# Patient Record
Sex: Female | Born: 1960 | Race: White | Hispanic: No | Marital: Married | Smoking: Never smoker
Health system: Southern US, Community
[De-identification: ages and names within clinical notes are randomized; demographics above are authoritative.]

## PROBLEM LIST (undated history)

## (undated) DIAGNOSIS — Z9289 Personal history of other medical treatment: Secondary | ICD-10-CM

## (undated) DIAGNOSIS — Z9889 Other specified postprocedural states: Secondary | ICD-10-CM

## (undated) DIAGNOSIS — J45909 Unspecified asthma, uncomplicated: Secondary | ICD-10-CM

## (undated) DIAGNOSIS — I10 Essential (primary) hypertension: Secondary | ICD-10-CM

## (undated) DIAGNOSIS — K219 Gastro-esophageal reflux disease without esophagitis: Secondary | ICD-10-CM

## (undated) DIAGNOSIS — J189 Pneumonia, unspecified organism: Secondary | ICD-10-CM

## (undated) DIAGNOSIS — F32A Depression, unspecified: Secondary | ICD-10-CM

## (undated) DIAGNOSIS — D649 Anemia, unspecified: Secondary | ICD-10-CM

## (undated) DIAGNOSIS — R112 Nausea with vomiting, unspecified: Secondary | ICD-10-CM

## (undated) DIAGNOSIS — M1990 Unspecified osteoarthritis, unspecified site: Secondary | ICD-10-CM

## (undated) DIAGNOSIS — K5900 Constipation, unspecified: Secondary | ICD-10-CM

## (undated) DIAGNOSIS — F419 Anxiety disorder, unspecified: Secondary | ICD-10-CM

## (undated) DIAGNOSIS — F329 Major depressive disorder, single episode, unspecified: Secondary | ICD-10-CM

## (undated) HISTORY — PX: LEG SURGERY: SHX1003

## (undated) HISTORY — PX: ABDOMINAL HYSTERECTOMY: SHX81

## (undated) HISTORY — PX: TUBAL LIGATION: SHX77

## (undated) HISTORY — PX: ANKLE SURGERY: SHX546

---

## 2015-08-06 ENCOUNTER — Emergency Department (HOSPITAL_BASED_OUTPATIENT_CLINIC_OR_DEPARTMENT_OTHER)
Admission: EM | Admit: 2015-08-06 | Discharge: 2015-08-06 | Disposition: A | Discharge status: Home / Self Care | Payer: 59 | Source: Home / Self Care | Attending: Emergency Medicine | Admitting: Emergency Medicine

## 2015-08-06 ENCOUNTER — Emergency Department (HOSPITAL_BASED_OUTPATIENT_CLINIC_OR_DEPARTMENT_OTHER): Payer: 59

## 2015-08-06 ENCOUNTER — Encounter (HOSPITAL_BASED_OUTPATIENT_CLINIC_OR_DEPARTMENT_OTHER): Payer: Self-pay | Admitting: *Deleted

## 2015-08-06 DIAGNOSIS — I10 Essential (primary) hypertension: Secondary | ICD-10-CM | POA: Diagnosis not present

## 2015-08-06 DIAGNOSIS — Z79899 Other long term (current) drug therapy: Secondary | ICD-10-CM | POA: Diagnosis not present

## 2015-08-06 DIAGNOSIS — K219 Gastro-esophageal reflux disease without esophagitis: Secondary | ICD-10-CM | POA: Diagnosis not present

## 2015-08-06 DIAGNOSIS — S6992XA Unspecified injury of left wrist, hand and finger(s), initial encounter: Secondary | ICD-10-CM | POA: Diagnosis present

## 2015-08-06 DIAGNOSIS — S59292A Other physeal fracture of lower end of radius, left arm, initial encounter for closed fracture: Secondary | ICD-10-CM | POA: Diagnosis not present

## 2015-08-06 DIAGNOSIS — J45909 Unspecified asthma, uncomplicated: Secondary | ICD-10-CM | POA: Diagnosis not present

## 2015-08-06 DIAGNOSIS — S52502A Unspecified fracture of the lower end of left radius, initial encounter for closed fracture: Secondary | ICD-10-CM

## 2015-08-06 DIAGNOSIS — W1809XA Striking against other object with subsequent fall, initial encounter: Secondary | ICD-10-CM | POA: Diagnosis not present

## 2015-08-06 DIAGNOSIS — S52572A Other intraarticular fracture of lower end of left radius, initial encounter for closed fracture: Secondary | ICD-10-CM | POA: Diagnosis not present

## 2015-08-06 DIAGNOSIS — F329 Major depressive disorder, single episode, unspecified: Secondary | ICD-10-CM | POA: Diagnosis not present

## 2015-08-06 DIAGNOSIS — S6412XA Injury of median nerve at wrist and hand level of left arm, initial encounter: Secondary | ICD-10-CM | POA: Diagnosis not present

## 2015-08-06 DIAGNOSIS — S52612A Displaced fracture of left ulna styloid process, initial encounter for closed fracture: Secondary | ICD-10-CM | POA: Diagnosis not present

## 2015-08-06 HISTORY — DX: Gastro-esophageal reflux disease without esophagitis: K21.9

## 2015-08-06 HISTORY — DX: Essential (primary) hypertension: I10

## 2015-08-06 MED ORDER — OXYCODONE-ACETAMINOPHEN 5-325 MG PO TABS
2.0000 | ORAL_TABLET | Freq: Once | ORAL | Status: AC
  Administered 2015-08-06: 2 via ORAL
  Filled 2015-08-06: qty 2

## 2015-08-06 MED ORDER — OXYCODONE-ACETAMINOPHEN 5-325 MG PO TABS: 1.0000 | ORAL_TABLET | Freq: Four times a day (QID) | ORAL | Status: DC | PRN

## 2015-08-06 MED ORDER — HYDROMORPHONE HCL 1 MG/ML IJ SOLN
1.0000 mg | Freq: Once | INTRAMUSCULAR | Status: AC
  Administered 2015-08-06: 1 mg via INTRAMUSCULAR
  Filled 2015-08-06: qty 1

## 2015-08-06 MED ORDER — HYDROMORPHONE HCL 1 MG/ML IJ SOLN: 1.0000 mg | Freq: Once | INTRAMUSCULAR | Status: DC

## 2015-08-06 NOTE — ED Provider Notes (Signed)
CSN: [id number]     Arrival date & time 08/06/15  2003 History   First MD Initiated Contact with Patient 08/06/15 2010     Chief Complaint  Patient presents with  . Fall  . Wrist Injury     (Consider location/radiation/quality/duration/timing/severity/associated sxs/prior Treatment) HPI Comments: 54 year old female presenting with left wrist pain after accidentally tripping and falling over a recliner less than 1 hour PTA. She landed onto her left wrist. Has throbbing pain rated 8/10 that radiates to the middle of her forearm and limited movement. No numbness or tingling. No medications prior to arrival.  Patient is a 54 y.o. female presenting with fall and wrist injury. The history is provided by the patient.  Fall This is a new problem. The current episode started today.  Wrist Injury   Past Medical History  Diagnosis Date  . Hypertension   . GERD (gastroesophageal reflux disease)    Past Surgical History  Procedure Laterality Date  . Leg surgery    . Ankle surgery    . Abdominal hysterectomy     No family history on file. Social History  Substance Use Topics  . Smoking status: Never Smoker   . Smokeless tobacco: None  . Alcohol Use: Yes     Comment: occasional   OB History    No data available     Review of Systems  Musculoskeletal:       + L wrist pain and swelling.  All other systems reviewed and are negative.     Allergies  Review of patient's allergies indicates no known allergies.  Home Medications   Prior to Admission medications   Medication Sig Start Date End Date Taking? Authorizing Provider  HYDROCHLOROTHIAZIDE PO Take by mouth.   Yes Historical Provider, MD  OMEPRAZOLE PO Take by mouth.   Yes Historical Provider, MD  oxyCODONE-acetaminophen (PERCOCET) 5-325 MG per tablet Take 1-2 tablets by mouth every 6 (six) hours as needed for severe pain. 08/06/15   Dorthula Bier M Leaman Abe, PA-C   BP 152/98 mmHg  Pulse 78  Temp(Src) 98.2 F (36.8 C) (Oral)  Resp  18  Ht  (1.626 m)  Wt 140 lb (63.504 kg)  BMI 24.02 kg/m2  SpO2 100% Physical Exam  Constitutional: She is oriented to person, place, and time. She appears well-developed and well-nourished. No distress.  HENT:  Head: Normocephalic and atraumatic.  Mouth/Throat: Oropharynx is clear and moist.  Eyes: Conjunctivae and EOM are normal.  Neck: Normal range of motion. Neck supple.  Cardiovascular: Normal rate, regular rhythm and normal heart sounds.   Pulmonary/Chest: Effort normal and breath sounds normal. No respiratory distress.  Musculoskeletal:  L wrist- TTP over carpal bones and distal radius with swelling over distal radius and anatomical snuffbox. ROM limited by pain. Able to wiggle fingers. +2 radial pulse. Cap refill < 2 seconds. Elbow normal.  Neurological: She is alert and oriented to person, place, and time. No sensory deficit.  Skin: Skin is warm and dry.  Psychiatric: She has a normal mood and affect. Her behavior is normal.  Nursing note and vitals reviewed.   ED Course  Procedures (including critical care time) Labs Review Labs Reviewed - No data to display  Imaging Review Dg Wrist Complete Left  08/06/2015   CLINICAL DATA:  Fall from a recliner onto outstretched hand. Pain and swelling in the wrist.  EXAM: LEFT WRIST - COMPLETE 3+ VIEW  COMPARISON:  None.  FINDINGS: Distal radial metaphyseal fracture, with transverse metaphyseal component and  longitudinal component extending to the distal radial articular surface. Apex volar angulation.  Transverse fracture of the ulnar styloid.  IMPRESSION: 1. Distal radial fracture with transverse metaphyseal component and longitudinal extension to the distal radial articular surface. 2. Transverse fracture of the ulnar styloid.   Electronically Signed   By: Gaylyn Rong M.D.   On: 08/06/2015 21:21   I have personally reviewed and evaluated these images and lab results as part of my medical decision-making.   EKG  Interpretation None      MDM   Final diagnoses:  Distal radius fracture, left, closed, initial encounter   Injury after mechanical fall. Neurovascularly intact distally. X-ray results as stated above. Dr. Rubin Payor spoke with Dr. Amanda Pea who looked at xrays and requests a splint and for the pt to f/u in his office tomorrow at [facility] as this may require surgical intervention. Splint applied. Rx percocet. Stable for d/c. Return precautions given. Patient states understanding of treatment care plan and is agreeable.  Discussed with attending Dr. Rubin Payor who agrees with plan of care.  Kathrynn Speed, PA-C 08/06/15 2132  Benjiman Core, MD 08/06/15 2308

## 2015-08-06 NOTE — Discharge Instructions (Signed)
Take percocet for severe pain only. No driving or operating heavy machinery while taking percocet. This medication may cause drowsiness.  Follow up with Dr. Amanda Pea tomorrow (8/17) at 4PM.  Radial Fracture You have a broken bone (fracture) of the forearm. This is the part of your arm between the elbow and your wrist. Your forearm is made up of two bones. These are the radius and ulna. Your fracture is in the radial shaft. This is the bone in your forearm located on the thumb side. A cast or splint is used to protect and keep your injured bone from moving. The cast or splint will be on generally for about 5 to 6 weeks, with individual variations. HOME CARE INSTRUCTIONS   Keep the injured part elevated while sitting or lying down. Keep the injury above the level of your heart (the center of the chest). This will decrease swelling and pain.  Apply ice to the injury for 15-20 minutes, 03-04 times per day while awake, for 2 days. Put the ice in a plastic bag and place a towel between the bag of ice and your cast or splint.  Move your fingers to avoid stiffness and minimize swelling.  If you have a plaster or fiberglass cast:  Do not try to scratch the skin under the cast using sharp or pointed objects.  Check the skin around the cast every day. You may put lotion on any red or sore areas.  Keep your cast dry and clean.  If you have a plaster splint:  Wear the splint as directed.  You may loosen the elastic around the splint if your fingers become numb, tingle, or turn cold or blue.  Do not put pressure on any part of your cast or splint. It may break. Rest your cast only on a pillow for the first 24 hours until it is fully hardened.  Your cast or splint can be protected during bathing with a plastic bag. Do not lower the cast or splint into water.  Only take over-the-counter or prescription medicines for pain, discomfort, or fever as directed by your caregiver. SEEK IMMEDIATE MEDICAL CARE  IF:   Your cast gets damaged or breaks.  You have more severe pain or swelling than you did before getting the cast.  You have severe pain when stretching your fingers.  There is a bad smell, new stains and/or pus-like (purulent) drainage coming from under the cast.  Your fingers or hand turn pale or blue and become cold or your loose feeling. Document Released: 05/20/2006 Document Revised: 02/29/2012 Document Reviewed: 08/16/2006 [facility] Patient Information 2015 [place], [place]. This information is not intended to replace advice given to you by your health care provider. Make sure you discuss any questions you have with your health care provider.

## 2015-08-06 NOTE — ED Notes (Signed)
Fell over a recliner. Injury to her left wrist. Deformity noted.

## 2015-08-07 ENCOUNTER — Other Ambulatory Visit (HOSPITAL_COMMUNITY): Payer: Self-pay | Admitting: *Deleted

## 2015-08-07 ENCOUNTER — Encounter (HOSPITAL_COMMUNITY): Payer: Self-pay | Admitting: *Deleted

## 2015-08-07 NOTE — Progress Notes (Signed)
Danielle Wagner was not at home when I spoke with her for her pre-op call. She couldn't remember all of her meds or dosages. She thinks she is on Metoprolol for her HTN. I told her that a pharmacy tech would probably call her tonight and to have her meds ready at that time and if they didn't to make a list with names, dosages and instruction to bring with her tomorrow. I told her if she is on Metoprolol, to take that in the AM along with her Prilosec, anxiety med (again she wasn't sure of name), Albuterol inhaler - if needed and Oxycodone - if needed. Danielle Wagner states that Dr. Amanda Pea told her to eat up until 6 AM tomorrow since her surgery is likely to be around 6:30 PM.

## 2015-08-08 ENCOUNTER — Inpatient Hospital Stay (HOSPITAL_COMMUNITY): Payer: 59 | Admitting: Anesthesiology

## 2015-08-08 ENCOUNTER — Encounter (HOSPITAL_COMMUNITY): Payer: Self-pay | Admitting: General Practice

## 2015-08-08 ENCOUNTER — Ambulatory Visit (HOSPITAL_COMMUNITY)
Admission: RE | Admit: 2015-08-08 | Discharge: 2015-08-08 | DRG: 512 | Disposition: A | Discharge status: Home / Self Care | Payer: 59 | Source: Ambulatory Visit | Attending: Orthopedic Surgery | Admitting: Orthopedic Surgery

## 2015-08-08 ENCOUNTER — Encounter (HOSPITAL_COMMUNITY)
Admission: RE | Disposition: A | Discharge status: Home / Self Care | Payer: Self-pay | Source: Ambulatory Visit | Attending: Orthopedic Surgery

## 2015-08-08 ENCOUNTER — Other Ambulatory Visit: Payer: Self-pay | Admitting: Orthopedic Surgery

## 2015-08-08 DIAGNOSIS — S59292A Other physeal fracture of lower end of radius, left arm, initial encounter for closed fracture: Secondary | ICD-10-CM | POA: Insufficient documentation

## 2015-08-08 DIAGNOSIS — J45909 Unspecified asthma, uncomplicated: Secondary | ICD-10-CM | POA: Insufficient documentation

## 2015-08-08 DIAGNOSIS — K219 Gastro-esophageal reflux disease without esophagitis: Secondary | ICD-10-CM | POA: Insufficient documentation

## 2015-08-08 DIAGNOSIS — Z79899 Other long term (current) drug therapy: Secondary | ICD-10-CM | POA: Insufficient documentation

## 2015-08-08 DIAGNOSIS — S52612A Displaced fracture of left ulna styloid process, initial encounter for closed fracture: Secondary | ICD-10-CM | POA: Insufficient documentation

## 2015-08-08 DIAGNOSIS — S52572A Other intraarticular fracture of lower end of left radius, initial encounter for closed fracture: Secondary | ICD-10-CM | POA: Insufficient documentation

## 2015-08-08 DIAGNOSIS — W1809XA Striking against other object with subsequent fall, initial encounter: Secondary | ICD-10-CM | POA: Insufficient documentation

## 2015-08-08 DIAGNOSIS — S6412XA Injury of median nerve at wrist and hand level of left arm, initial encounter: Secondary | ICD-10-CM | POA: Insufficient documentation

## 2015-08-08 DIAGNOSIS — F329 Major depressive disorder, single episode, unspecified: Secondary | ICD-10-CM | POA: Diagnosis not present

## 2015-08-08 DIAGNOSIS — I10 Essential (primary) hypertension: Secondary | ICD-10-CM | POA: Insufficient documentation

## 2015-08-08 DIAGNOSIS — S52502A Unspecified fracture of the lower end of left radius, initial encounter for closed fracture: Secondary | ICD-10-CM | POA: Diagnosis not present

## 2015-08-08 HISTORY — PX: OPEN REDUCTION INTERNAL FIXATION (ORIF) DISTAL RADIAL FRACTURE: SHX5989

## 2015-08-08 HISTORY — DX: Nausea with vomiting, unspecified: Z98.890

## 2015-08-08 HISTORY — DX: Other specified postprocedural states: R11.2

## 2015-08-08 HISTORY — PX: CARPAL TUNNEL RELEASE: SHX101

## 2015-08-08 HISTORY — DX: Constipation, unspecified: K59.00

## 2015-08-08 HISTORY — DX: Pneumonia, unspecified organism: J18.9

## 2015-08-08 HISTORY — DX: Personal history of other medical treatment: Z92.89

## 2015-08-08 HISTORY — DX: Anxiety disorder, unspecified: F41.9

## 2015-08-08 HISTORY — DX: Major depressive disorder, single episode, unspecified: F32.9

## 2015-08-08 HISTORY — DX: Depression, unspecified: F32.A

## 2015-08-08 HISTORY — DX: Unspecified osteoarthritis, unspecified site: M19.90

## 2015-08-08 HISTORY — DX: Anemia, unspecified: D64.9

## 2015-08-08 HISTORY — DX: Unspecified asthma, uncomplicated: J45.909

## 2015-08-08 LAB — BASIC METABOLIC PANEL
Anion gap: 10 (ref 5–15)
BUN: 11 mg/dL (ref 6–20)
CALCIUM: 10 mg/dL (ref 8.9–10.3)
CO2: 26 mmol/L (ref 22–32)
Chloride: 102 mmol/L (ref 101–111)
Creatinine, Ser: 0.83 mg/dL (ref 0.44–1.00)
GFR calc Af Amer: >60 mL/min (ref >60)
GLUCOSE: 112 mg/dL — AB (ref 65–99)
POTASSIUM: 3.8 mmol/L (ref 3.5–5.1)
Sodium: 138 mmol/L (ref 135–145)

## 2015-08-08 LAB — CBC
HEMATOCRIT: 41.2 % (ref 36.0–46.0)
Hemoglobin: 14.1 g/dL (ref 12.0–15.0)
MCH: 32.6 pg (ref 26.0–34.0)
MCHC: 34.2 g/dL (ref 30.0–36.0)
MCV: 95.2 fL (ref 78.0–100.0)
Platelets: 203 10*3/uL (ref 150–400)
RBC: 4.33 MIL/uL (ref 3.87–5.11)
RDW: 12.4 % (ref 11.5–15.5)
WBC: 6.1 10*3/uL (ref 4.0–10.5)

## 2015-08-08 SURGERY — OPEN REDUCTION INTERNAL FIXATION (ORIF) DISTAL RADIUS FRACTURE
Anesthesia: General | Site: Wrist | Laterality: Left

## 2015-08-08 MED ORDER — FENTANYL CITRATE (PF) 100 MCG/2ML IJ SOLN
INTRAMUSCULAR | Status: AC
  Administered 2015-08-08: 50 ug via INTRAVENOUS
  Filled 2015-08-08: qty 2

## 2015-08-08 MED ORDER — MIDAZOLAM HCL 2 MG/2ML IJ SOLN
2.0000 mg | Freq: Once | INTRAMUSCULAR | Status: AC
  Administered 2015-08-08: 2 mg via INTRAVENOUS
  Filled 2015-08-08: qty 2

## 2015-08-08 MED ORDER — 0.9 % SODIUM CHLORIDE (POUR BTL) OPTIME
TOPICAL | Status: DC | PRN
  Administered 2015-08-08: 1000 mL

## 2015-08-08 MED ORDER — SODIUM CHLORIDE 0.45 % IV SOLN: INTRAVENOUS | Status: DC

## 2015-08-08 MED ORDER — ONDANSETRON HCL 4 MG/2ML IJ SOLN
INTRAMUSCULAR | Status: AC
  Filled 2015-08-08: qty 2

## 2015-08-08 MED ORDER — BUPIVACAINE HCL (PF) 0.25 % IJ SOLN
INTRAMUSCULAR | Status: AC
  Filled 2015-08-08: qty 30

## 2015-08-08 MED ORDER — ONDANSETRON HCL 4 MG/2ML IJ SOLN
INTRAMUSCULAR | Status: DC | PRN
  Administered 2015-08-08: 4 mg via INTRAVENOUS

## 2015-08-08 MED ORDER — DEXAMETHASONE SODIUM PHOSPHATE 4 MG/ML IJ SOLN
INTRAMUSCULAR | Status: AC
  Filled 2015-08-08: qty 1

## 2015-08-08 MED ORDER — LACTATED RINGERS IV SOLN
INTRAVENOUS | Status: DC
  Administered 2015-08-08: 16:00:00 via INTRAVENOUS

## 2015-08-08 MED ORDER — MIDAZOLAM HCL 2 MG/2ML IJ SOLN
INTRAMUSCULAR | Status: AC
  Administered 2015-08-08: 2 mg via INTRAVENOUS
  Filled 2015-08-08: qty 2

## 2015-08-08 MED ORDER — FENTANYL CITRATE (PF) 100 MCG/2ML IJ SOLN
100.0000 ug | Freq: Once | INTRAMUSCULAR | Status: AC
  Administered 2015-08-08: 50 ug via INTRAVENOUS
  Filled 2015-08-08: qty 2

## 2015-08-08 MED ORDER — METHOCARBAMOL 500 MG PO TABS: 500.0000 mg | ORAL_TABLET | Freq: Four times a day (QID) | ORAL | Status: AC

## 2015-08-08 MED ORDER — DEXAMETHASONE SODIUM PHOSPHATE 4 MG/ML IJ SOLN
INTRAMUSCULAR | Status: DC | PRN
  Administered 2015-08-08: 4 mg via INTRAVENOUS

## 2015-08-08 MED ORDER — ONDANSETRON HCL 4 MG/2ML IJ SOLN
4.0000 mg | Freq: Once | INTRAMUSCULAR | Status: AC | PRN
  Administered 2015-08-08: 4 mg via INTRAVENOUS

## 2015-08-08 MED ORDER — MIDAZOLAM HCL 2 MG/2ML IJ SOLN
INTRAMUSCULAR | Status: AC
  Filled 2015-08-08: qty 4

## 2015-08-08 MED ORDER — BUPIVACAINE HCL (PF) 0.5 % IJ SOLN
INTRAMUSCULAR | Status: AC
  Filled 2015-08-08: qty 30

## 2015-08-08 MED ORDER — FENTANYL CITRATE (PF) 250 MCG/5ML IJ SOLN
INTRAMUSCULAR | Status: AC
  Filled 2015-08-08: qty 5

## 2015-08-08 MED ORDER — FENTANYL CITRATE (PF) 100 MCG/2ML IJ SOLN
50.0000 ug | Freq: Once | INTRAMUSCULAR | Status: AC
  Administered 2015-08-08: 50 ug via INTRAVENOUS
  Filled 2015-08-08: qty 2

## 2015-08-08 MED ORDER — HYDROMORPHONE HCL 1 MG/ML IJ SOLN: 0.2500 mg | INTRAMUSCULAR | Status: DC | PRN

## 2015-08-08 MED ORDER — OXYCODONE HCL 5 MG PO TABS: 10.0000 mg | ORAL_TABLET | ORAL | Status: AC | PRN

## 2015-08-08 MED ORDER — LACTATED RINGERS IV SOLN
INTRAVENOUS | Status: DC | PRN
  Administered 2015-08-08: 19:00:00 via INTRAVENOUS

## 2015-08-08 MED ORDER — CEPHALEXIN 500 MG PO CAPS: 500.0000 mg | ORAL_CAPSULE | Freq: Four times a day (QID) | ORAL | Status: AC

## 2015-08-08 MED ORDER — PROPOFOL 10 MG/ML IV BOLUS
INTRAVENOUS | Status: DC | PRN
  Administered 2015-08-08: 200 mg via INTRAVENOUS

## 2015-08-08 MED ORDER — CEFAZOLIN SODIUM-DEXTROSE 2-3 GM-% IV SOLR
2.0000 g | INTRAVENOUS | Status: AC
  Administered 2015-08-08: 2 g via INTRAVENOUS
  Filled 2015-08-08: qty 50

## 2015-08-08 MED ORDER — FENTANYL CITRATE (PF) 250 MCG/5ML IJ SOLN
INTRAMUSCULAR | Status: DC | PRN
  Administered 2015-08-08: 100 ug via INTRAVENOUS

## 2015-08-08 MED ORDER — OXYCODONE HCL 5 MG PO TABS: 5.0000 mg | ORAL_TABLET | Freq: Once | ORAL | Status: DC | PRN

## 2015-08-08 MED ORDER — CHLORHEXIDINE GLUCONATE 4 % EX LIQD: 60.0000 mL | Freq: Once | CUTANEOUS | Status: DC

## 2015-08-08 MED ORDER — OXYCODONE HCL 5 MG/5ML PO SOLN: 5.0000 mg | Freq: Once | ORAL | Status: DC | PRN

## 2015-08-08 MED ORDER — MIDAZOLAM HCL 2 MG/2ML IJ SOLN
INTRAMUSCULAR | Status: DC | PRN
  Administered 2015-08-08: 2 mg via INTRAVENOUS

## 2015-08-08 MED ORDER — BUPIVACAINE-EPINEPHRINE (PF) 0.5% -1:200000 IJ SOLN
INTRAMUSCULAR | Status: DC | PRN
  Administered 2015-08-08: 20 mL via PERINEURAL

## 2015-08-08 SURGICAL SUPPLY — 71 items
BANDAGE ELASTIC 3 VELCRO ST LF (GAUZE/BANDAGES/DRESSINGS) ×3 IMPLANT
BANDAGE ELASTIC 4 VELCRO ST LF (GAUZE/BANDAGES/DRESSINGS) ×3 IMPLANT
BIT DRILL 2.2 SS TIBIAL (BIT) ×3 IMPLANT
BLADE SURG ROTATE 9660 (MISCELLANEOUS) IMPLANT
BNDG ESMARK 4X9 LF (GAUZE/BANDAGES/DRESSINGS) ×3 IMPLANT
BNDG GAUZE ELAST 4 BULKY (GAUZE/BANDAGES/DRESSINGS) ×9 IMPLANT
CORDS BIPOLAR (ELECTRODE) ×3 IMPLANT
COVER SURGICAL LIGHT HANDLE (MISCELLANEOUS) ×3 IMPLANT
CUFF TOURNIQUET SINGLE 18IN (TOURNIQUET CUFF) ×3 IMPLANT
CUFF TOURNIQUET SINGLE 24IN (TOURNIQUET CUFF) IMPLANT
DECANTER SPIKE VIAL GLASS SM (MISCELLANEOUS) IMPLANT
DRAIN TLS ROUND 10FR (DRAIN) IMPLANT
DRAPE OEC MINIVIEW 54X84 (DRAPES) IMPLANT
DRAPE STERI IOBAN 125X83 (DRAPES) ×3 IMPLANT
DRAPE SURG 17X23 STRL (DRAPES) ×3 IMPLANT
DRAPE U-SHAPE 47X51 STRL (DRAPES) ×3 IMPLANT
DRSG ADAPTIC 3X8 NADH LF (GAUZE/BANDAGES/DRESSINGS) ×3 IMPLANT
EVACUATOR 1/8 PVC DRAIN (DRAIN) IMPLANT
GAUZE SPONGE 4X4 12PLY STRL (GAUZE/BANDAGES/DRESSINGS) ×3 IMPLANT
GAUZE XEROFORM 1X8 LF (GAUZE/BANDAGES/DRESSINGS) ×3 IMPLANT
GAUZE XEROFORM 5X9 LF (GAUZE/BANDAGES/DRESSINGS) ×3 IMPLANT
GLOVE BIOGEL M STRL SZ7.5 (GLOVE) ×3 IMPLANT
GLOVE ORTHO TXT STRL SZ7.5 (GLOVE) ×3 IMPLANT
GLOVE SS BIOGEL STRL SZ 8 (GLOVE) ×1 IMPLANT
GLOVE SUPERSENSE BIOGEL SZ 8 (GLOVE) ×2
GOWN STRL REUS W/ TWL LRG LVL3 (GOWN DISPOSABLE) ×3 IMPLANT
GOWN STRL REUS W/ TWL XL LVL3 (GOWN DISPOSABLE) ×3 IMPLANT
GOWN STRL REUS W/TWL LRG LVL3 (GOWN DISPOSABLE) ×6
GOWN STRL REUS W/TWL XL LVL3 (GOWN DISPOSABLE) ×6
KIT BASIN OR (CUSTOM PROCEDURE TRAY) ×3 IMPLANT
KIT ROOM TURNOVER OR (KITS) ×3 IMPLANT
LOOP VESSEL MAXI BLUE (MISCELLANEOUS) IMPLANT
MANIFOLD NEPTUNE II (INSTRUMENTS) ×3 IMPLANT
NEEDLE 22X1 1/2 (OR ONLY) (NEEDLE) IMPLANT
NEEDLE HYPO 25GX1X1/2 BEV (NEEDLE) IMPLANT
NS IRRIG 1000ML POUR BTL (IV SOLUTION) ×3 IMPLANT
PACK ORTHO EXTREMITY (CUSTOM PROCEDURE TRAY) ×3 IMPLANT
PAD ARMBOARD 7.5X6 YLW CONV (MISCELLANEOUS) ×6 IMPLANT
PAD CAST 4YDX4 CTTN HI CHSV (CAST SUPPLIES) ×2 IMPLANT
PADDING CAST ABS 4INX4YD NS (CAST SUPPLIES) ×2
PADDING CAST ABS COTTON 4X4 ST (CAST SUPPLIES) ×1 IMPLANT
PADDING CAST COTTON 4X4 STRL (CAST SUPPLIES) ×4
PEG LOCKING SMOOTH 2.2X16 (Screw) ×3 IMPLANT
PEG LOCKING SMOOTH 2.2X18 (Peg) ×6 IMPLANT
PEG LOCKING SMOOTH 2.2X20 (Screw) ×9 IMPLANT
PLATE NARROW DVR LEFT (Plate) ×3 IMPLANT
SCREW LOCK 12X2.7X 3 LD (Screw) ×2 IMPLANT
SCREW LOCK 20X2.7X 3 LD TPR (Screw) ×1 IMPLANT
SCREW LOCKING 2.7X12MM (Screw) ×4 IMPLANT
SCREW LOCKING 2.7X13MM (Screw) ×6 IMPLANT
SCREW LOCKING 2.7X20MM (Screw) ×2 IMPLANT
SOLUTION BETADINE 4OZ (MISCELLANEOUS) ×3 IMPLANT
SPLINT FIBERGLASS 3X12 (CAST SUPPLIES) ×3 IMPLANT
SPONGE LAP 4X18 X RAY DECT (DISPOSABLE) IMPLANT
SPONGE SCRUB IODOPHOR (GAUZE/BANDAGES/DRESSINGS) ×3 IMPLANT
SUCTION FRAZIER TIP 10 FR DISP (SUCTIONS) IMPLANT
SUT MNCRL AB 4-0 PS2 18 (SUTURE) ×3 IMPLANT
SUT PROLENE 3 0 PS 2 (SUTURE) IMPLANT
SUT PROLENE 4 0 PS 2 18 (SUTURE) ×9 IMPLANT
SUT VIC AB 2-0 CT1 27 (SUTURE) ×2
SUT VIC AB 2-0 CT1 TAPERPNT 27 (SUTURE) ×1 IMPLANT
SUT VIC AB 3-0 FS2 27 (SUTURE) IMPLANT
SYR CONTROL 10ML LL (SYRINGE) IMPLANT
SYSTEM CHEST DRAIN TLS 7FR (DRAIN) ×3 IMPLANT
TOWEL OR 17X24 6PK STRL BLUE (TOWEL DISPOSABLE) ×3 IMPLANT
TOWEL OR 17X26 10 PK STRL BLUE (TOWEL DISPOSABLE) ×3 IMPLANT
TUBE CONNECTING 12'X1/4 (SUCTIONS) ×1
TUBE CONNECTING 12X1/4 (SUCTIONS) ×2 IMPLANT
TUBE EVACUATION TLS (MISCELLANEOUS) ×3 IMPLANT
UNDERPAD 30X30 INCONTINENT (UNDERPADS AND DIAPERS) ×3 IMPLANT
WATER STERILE IRR 1000ML POUR (IV SOLUTION) ×3 IMPLANT

## 2015-08-08 NOTE — OR Nursing (Signed)
Discharge instructions reviewed with patient and husband. Husband states that Dr. Amanda Pea instructed him to remove drain tomorrow at dinner time. RN instructed patient and husband regarding changing vacutainer.

## 2015-08-08 NOTE — Progress Notes (Signed)
Patient complaining of 9/10 left arm pain.  Notified Dr. Hart Rochester and received verbal order for 50 mcg of Fentanyl.  Will continue to monitor patient.

## 2015-08-08 NOTE — Discharge Instructions (Signed)

## 2015-08-08 NOTE — Anesthesia Postprocedure Evaluation (Signed)
Anesthesia Post Note  Patient: Danielle Wagner  Procedure(s) Performed: Procedure(s) (LRB): OPEN REDUCTION INTERNAL FIXATION (ORIF) DISTAL RADIAL FRACTURE (Left) CARPAL TUNNEL RELEASE (Left)  Anesthesia type: general  Patient location: PACU  Post pain: Pain level controlled  Post assessment: Patient's Cardiovascular Status Stable  Last Vitals:  Filed Vitals:   08/08/15 2044  BP: 155/87  Pulse: 66  Temp:   Resp: 17    Post vital signs: Reviewed and stable  Level of consciousness: sedated  Complications: No apparent anesthesia complications

## 2015-08-08 NOTE — Progress Notes (Signed)
   08/08/15 1521  OBSTRUCTIVE SLEEP APNEA  Have you ever been diagnosed with sleep apnea through a sleep study? No  Do you snore loudly (loud enough to be heard through closed doors)?  1  Do you often feel tired, fatigued, or sleepy during the daytime? 1  Has anyone observed you stop breathing during your sleep? 0  Do you have, or are you being treated for high blood pressure? 1  BMI more than 35 kg/m2? 0  Age over 56 years old? 1  Neck circumference greater than 40 cm/16 inches? 0  Gender: 0  Obstructive Sleep Apnea Score 4

## 2015-08-08 NOTE — Anesthesia Procedure Notes (Addendum)
Anesthesia Regional Block:  Supraclavicular block  Pre-Anesthetic Checklist: ,, timeout performed, Correct Patient, Correct Site, Correct Laterality, Correct Procedure, Correct Position, site marked, Risks and benefits discussed,  Surgical consent,  Pre-op evaluation,  At surgeon's request and post-op pain management  Laterality: Left  Prep: chloraprep       Needles:  Injection technique: Single-shot  Needle Type: Stimiplex     Needle Length: 5cm 5 cm Needle Gauge: 21 and 21 G    Additional Needles:  Procedures: ultrasound guided (picture in chart) Supraclavicular block Narrative:  Injection made incrementally with aspirations every 5 mL.  Performed by: Personally  Anesthesiologist: JUDD, BENJAMIN  Additional Notes: Risks, benefits and alternative to block explained extensively.  Patient tolerated procedure well, without complications.   Procedure Name: LMA Insertion Date/Time: 08/08/2015 7:01 PM Performed by: Molli Hazard Pre-anesthesia Checklist: Patient identified, Emergency Drugs available, Suction available and Patient being monitored Patient Re-evaluated:Patient Re-evaluated prior to inductionOxygen Delivery Method: Circle system utilized Preoxygenation: Pre-oxygenation with 100% oxygen Intubation Type: IV induction Ventilation: Mask ventilation without difficulty LMA: LMA inserted LMA Size: 4.0 Number of attempts: 1 Placement Confirmation: positive ETCO2 and breath sounds checked- equal and bilateral Tube secured with: Tape Dental Injury: Teeth and Oropharynx as per pre-operative assessment

## 2015-08-08 NOTE — Anesthesia Preprocedure Evaluation (Signed)
Anesthesia Evaluation  Patient identified by MRN, date of birth, ID band Patient awake    Reviewed: Allergy & Precautions, H&P , NPO status , Patient's Chart, lab work & pertinent test results  History of Anesthesia Complications (+) PONV and history of anesthetic complications  Airway Mallampati: II  TM Distance: >3 FB Neck ROM: full    Dental no notable dental hx.    Pulmonary asthma ,  breath sounds clear to auscultation  Pulmonary exam normal       Cardiovascular hypertension, Pt. on medications negative cardio ROS Normal cardiovascular examRhythm:regular Rate:Normal     Neuro/Psych Anxiety Depression negative neurological ROS     GI/Hepatic Neg liver ROS, GERD-  ,  Endo/Other  negative endocrine ROS  Renal/GU negative Renal ROS     Musculoskeletal   Abdominal   Peds  Hematology negative hematology ROS (+)   Anesthesia Other Findings   Reproductive/Obstetrics negative OB ROS                             Anesthesia Physical Anesthesia Plan  ASA: II  Anesthesia Plan: General   Post-op Pain Management: GA combined w/ Regional for post-op pain   Induction: Intravenous  Airway Management Planned: LMA  Additional Equipment:   Intra-op Plan:   Post-operative Plan:   Informed Consent: I have reviewed the patients History and Physical, chart, labs and discussed the procedure including the risks, benefits and alternatives for the proposed anesthesia with the patient or authorized representative who has indicated his/her understanding and acceptance.     Plan Discussed with: Anesthesiologist, CRNA and Surgeon  Anesthesia Plan Comments:         Anesthesia Quick Evaluation

## 2015-08-08 NOTE — Op Note (Signed)
See [id number] Amanda Pea Md

## 2015-08-08 NOTE — Transfer of Care (Signed)
Immediate Anesthesia Transfer of Care Note  Patient: Danielle Wagner  Procedure(s) Performed: Procedure(s): OPEN REDUCTION INTERNAL FIXATION (ORIF) DISTAL RADIAL FRACTURE (Left) CARPAL TUNNEL RELEASE (Left)  Patient Location: PACU  Anesthesia Type:GA combined with regional for post-op pain  Level of Consciousness: awake, alert  and oriented  Airway & Oxygen Therapy: Patient connected to nasal cannula oxygen  Post-op Assessment: Report given to RN and Post -op Vital signs reviewed and stable  Post vital signs: Reviewed and stable  Last Vitals:  Filed Vitals:   08/08/15 1815  BP: 162/83  Pulse: 73  Temp:   Resp: 16    Complications: No apparent anesthesia complications

## 2015-08-08 NOTE — Progress Notes (Signed)
Orthopedic Tech Progress Note Patient Details:  Danielle Wagner May 14, 1961 [id number] Applied arm sling to LUE. Ortho Devices Type of Ortho Device: Arm sling Ortho Device/Splint Location: LUE Ortho Device/Splint Interventions: Application   [name] 08/08/2015, 8:39 PM

## 2015-08-08 NOTE — H&P (Signed)
Danielle Wagner is an 54 y.o. female.   Chief Complaint: Left wrist comminuted complex and articular grade 3 part fracture HPI: Patient presents for open reduction internal fixation greater than 3 part intra-articular fracture right radius. Patient understands risks and benefits. Patient denies neck back chest or abdominal pain. The patient does have median nerve dysesthesia indicative of carpal tunnel syndrome acute and chronic in nature. We will plan a carpal tunnel release as well.  Patient denies neck back chest or abdominal pain  Past Medical History  Diagnosis Date  . Hypertension   . GERD (gastroesophageal reflux disease)   . History of blood transfusion   . Asthma   . Pneumonia   . Depression   . Anxiety   . Arthritis   . Anemia   . Constipation   . PONV (postoperative nausea and vomiting)     slight nausea    Past Surgical History  Procedure Laterality Date  . Leg surgery    . Ankle surgery    . Abdominal hysterectomy    . Tubal ligation      Family History  Problem Relation Age of Onset  . Hypertension Mother   . Depression Mother   . Diabetes type II Mother   . Stomach cancer Father   . Heart disease Father    Social History:  reports that she has never smoked. She has never used smokeless tobacco. She reports that she drinks alcohol. She reports that she does not use illicit drugs.  Allergies: No Known Allergies  Medications Prior to Admission  Medication Sig Dispense Refill  . DULoxetine (CYMBALTA) 30 MG capsule Take 30 mg by mouth daily.    . hydrochlorothiazide (HYDRODIURIL) 25 MG tablet Take 25 mg by mouth daily.    . metoprolol tartrate (LOPRESSOR) 25 MG tablet Take 25 mg by mouth 2 (two) times daily.    . OMEPRAZOLE PO Take 20 mg by mouth daily.     . oxyCODONE (OXY IR/ROXICODONE) 5 MG immediate release tablet Take 5 mg by mouth every 6 (six) hours as needed.      Results for orders placed or performed during the hospital encounter of 08/08/15 (from  the past 48 hour(s))  Basic metabolic panel     Status: Abnormal   Collection Time: 08/08/15  3:33 PM  Result Value Ref Range   Sodium 138 135 - 145 mmol/L   Potassium 3.8 3.5 - 5.1 mmol/L   Chloride 102 101 - 111 mmol/L   CO2 26 22 - 32 mmol/L   Glucose, Bld 112 (H) 65 - 99 mg/dL   BUN 11 6 - 20 mg/dL   Creatinine, Ser 0.83 0.44 - 1.00 mg/dL   Calcium 10.0 8.9 - 10.3 mg/dL   GFR calc non Af Amer >60 >60 mL/min   GFR calc Af Amer >60 >60 mL/min    Comment: (NOTE) The eGFR has been calculated using the CKD EPI equation. This calculation has not been validated in all clinical situations. eGFR's persistently <60 mL/min signify possible Chronic Kidney Disease.    Anion gap 10 5 - 15  CBC     Status: None   Collection Time: 08/08/15  3:33 PM  Result Value Ref Range   WBC 6.1 4.0 - 10.5 K/uL   RBC 4.33 3.87 - 5.11 MIL/uL   Hemoglobin 14.1 12.0 - 15.0 g/dL   HCT 41.2 36.0 - 46.0 %   MCV 95.2 78.0 - 100.0 fL   MCH 32.6 26.0 - 34.0 pg     MCHC 34.2 30.0 - 36.0 g/dL   RDW 12.4 11.5 - 15.5 %   Platelets 203 150 - 400 K/uL   Dg Wrist Complete Left  08/06/2015   CLINICAL DATA:  Fall from a recliner onto outstretched hand. Pain and swelling in the wrist.  EXAM: LEFT WRIST - COMPLETE 3+ VIEW  COMPARISON:  None.  FINDINGS: Distal radial metaphyseal fracture, with transverse metaphyseal component and longitudinal component extending to the distal radial articular surface. Apex volar angulation.  Transverse fracture of the ulnar styloid.  IMPRESSION: 1. Distal radial fracture with transverse metaphyseal component and longitudinal extension to the distal radial articular surface. 2. Transverse fracture of the ulnar styloid.   Electronically Signed   By: Van Clines M.D.   On: 08/06/2015 21:21    Review of Systems  Constitutional: Negative.   Eyes: Negative.   Respiratory: Negative.   Cardiovascular: Negative.   Genitourinary: Negative.   Skin: Negative.   Neurological: Negative.    Psychiatric/Behavioral: Negative.     Blood pressure 149/101, pulse 70, temperature 98.1 F (36.7 C), temperature source Oral, resp. rate 12, height 5' 4" (1.626 m), weight 63.504 kg (140 lb), SpO2 100 %. Physical Exam  Displaced grade 3 part intra-articular distal radius fracture left upper extremity. Patient also has carpal tunnel symptomatology which is acute and chronic in nature. Patient has no signs of infection dystrophy or compartment syndrome  The patient is alert and oriented in no acute distress. The patient complains of pain in the affected upper extremity.  The patient is noted to have a normal HEENT exam. Lung fields show equal chest expansion and no shortness of breath. Abdomen exam is nontender without distention. Lower extremity examination does not show any fracture dislocation or blood clot symptoms. Pelvis is stable and the neck and back are stable and nontender. Assessment/Plan Plan for open left carpal tunnel release and ORIF greater than 3 part intra-articular fracture with repair reconstruction is necessary We are planning surgery for your upper extremity. The risk and benefits of surgery to include risk of bleeding, infection, anesthesia,  damage to normal structures and failure of the surgery to accomplish its intended goals of relieving symptoms and restoring function have been discussed in detail. With this in mind we plan to proceed. I have specifically discussed with the patient the pre-and postoperative regime and the dos and don'ts and risk and benefits in great detail. Risk and benefits of surgery also include risk of dystrophy(CRPS), chronic nerve pain, failure of the healing process to go onto completion and other inherent risks of surgery The relavent the pathophysiology of the disease/injury process, as well as the alternatives for treatment and postoperative course of action has been discussed in great detail with the patient who desires to proceed.  We  will do everything in our power to help you (the patient) restore function to the upper extremity. It is a pleasure to see this patient today.  Paulene Floor 08/08/2015, 5:08 PM

## 2015-08-09 ENCOUNTER — Encounter (HOSPITAL_COMMUNITY): Payer: Self-pay | Admitting: Orthopedic Surgery

## 2015-08-09 NOTE — Op Note (Signed)
Danielle Wagner, Danielle Wagner                ACCOUNT NO.:  [id number]  MEDICAL RECORD NO.:  [id number]  LOCATION:  [facility]                         FACILITY:  [facility]  PHYSICIAN:  Dionne Ano. Leasia Swann, M.D.DATE OF BIRTH:  10-22-1961  DATE OF PROCEDURE:  08/08/2015 DATE OF DISCHARGE:  08/08/2015                              OPERATIVE REPORT   PREOPERATIVE DIAGNOSES:  Comminuted complex greater than 3-part intra- articular left distal radius fracture with associated median nerve neurapraxia and acute and chronic carpal tunnel symptomatology.  POSTOPERATIVE DIAGNOSES:  Comminuted complex greater than 3-part intra- articular left distal radius fracture with associated median nerve neurapraxia and acute and chronic carpal tunnel symptomatology.  PROCEDURE: 1. Open reduction and internal fixation, greater than 3-part     comminuted intra-articular distal radius fracture left upper     extremity. 2. Evaluation under anesthesia distal radioulnar joint with closed     treatment of a very small ulnar styloid fracture being performed. 3. Stress radiography/AP, lateral, and oblique x-rays of the wrist     performed, examined, and interpreted by myself. 4. Left open carpal tunnel release with a large amount hematoma found     within the carpal canal.  SURGEON:  Dionne Ano. Amanda Pea, M.D.  ASSISTANT:  Karie Chimera, PA-C.  COMPLICATIONS:  None.  ANESTHESIA:  General with preoperative block.  TOURNIQUET TIME:  Less than an hour.  INDICATIONS:  This is a female in her 31s.  She presents with the above- mentioned diagnosis.  She has what appears to be some degree of acute and chronic carpal tunnel symptoms with a comminuted distal radius fracture.  We plan for the above-mentioned operative intervention, understand accepting the risks and benefits of surgery.  OPERATION IN DETAIL:  The patient was seen by myself and Anesthesia, taken to the operative theater and underwent a smooth induction of general  anesthesia.  Preoperatively, a block was placed.  She was prepped and draped in usual sterile fashion with 2 separate Hibiclens scrubs about the left upper extremity.  Following this, outline marks were made visually and with pen.  Sterile field was secured and the patient then underwent a very careful and cautious volar radial approach to the wrist under 250 mmHg of tourniquet application.  Dissection was carried down.  FCR tendon sheath was incised palmarly and dorsally. Carpal canal contents retracted ulnarly.  Pronator incised.  Fracture was then reduced.  A narrow Biomet DVR cross-lock plate was placed in standard technique achieving adequate radial height, inclination, and volar tilt.  We replaced this in the findings.  We then repaired the pronator and checked AP, lateral and oblique x-rays which looked excellent.  The pronator was closed with Vicryl, reduction looked excellent.  There was some comminution dorsally, but I did not feel that this was worthy of a decompression and we will keep a close eye on the EPL tendon.  The distal radioulnar joint was stable.  The patient did have an ulnar styloid fracture, but this was not grossly unstable at the time of surgical intervention.  We will keep a very close eye on this as well.  The wrist was stressed and was stable about the radiocarpal and midcarpal joints.  Following this, a 2 to 2.5 cm incision was made about the transverse carpal ligament.  Dissection was carried down.  Palmar fascia incised. Distal edge released under 4.0 loupe magnification.  Fat pad egressed nicely.  Superficial palmar arch protected and distal proximal dissection was carried out until adequate room was available for sliding directly some blunt tip scissors about the proximal leaflet.  This was all done under direct vision without complicating feature and there was a large amount of hematoma in the carpal canal indicative of the acute carpal tunnel  symptomatology.  The patient tolerated this well.  There were no complicating features.  Wounds were irrigated copiously, closed after hemostasis was secured with Prolene.  All sponge, needle, and instrument counts were reported as correct.  The patient will be placed in a volar wrist brace.  We will see her back in the office in 12-14 days.  Elevate, move, massage fingers.  Notify us should any problems occur.  Keflex as well as OxyIR and Robaxin were written for postop meds.  These notes have been discussed.  All questions have been encouraged and answered.  Should any problems arise, we will be immediately available.     Dionne Ano. Amanda Pea, M.D.     [facility]  D:  08/08/2015  T:  08/09/2015  Job:  [id number]

## 2016-09-05 IMAGING — DX DG WRIST COMPLETE 3+V*L*
4 series · 4 of 4 positions shown · non-contrast
Comparison: None.

CLINICAL DATA: Fall from a recliner onto outstretched hand. Pain
and swelling in the wrist.

EXAM:
LEFT WRIST - COMPLETE 3+ VIEW

[wrist pa]
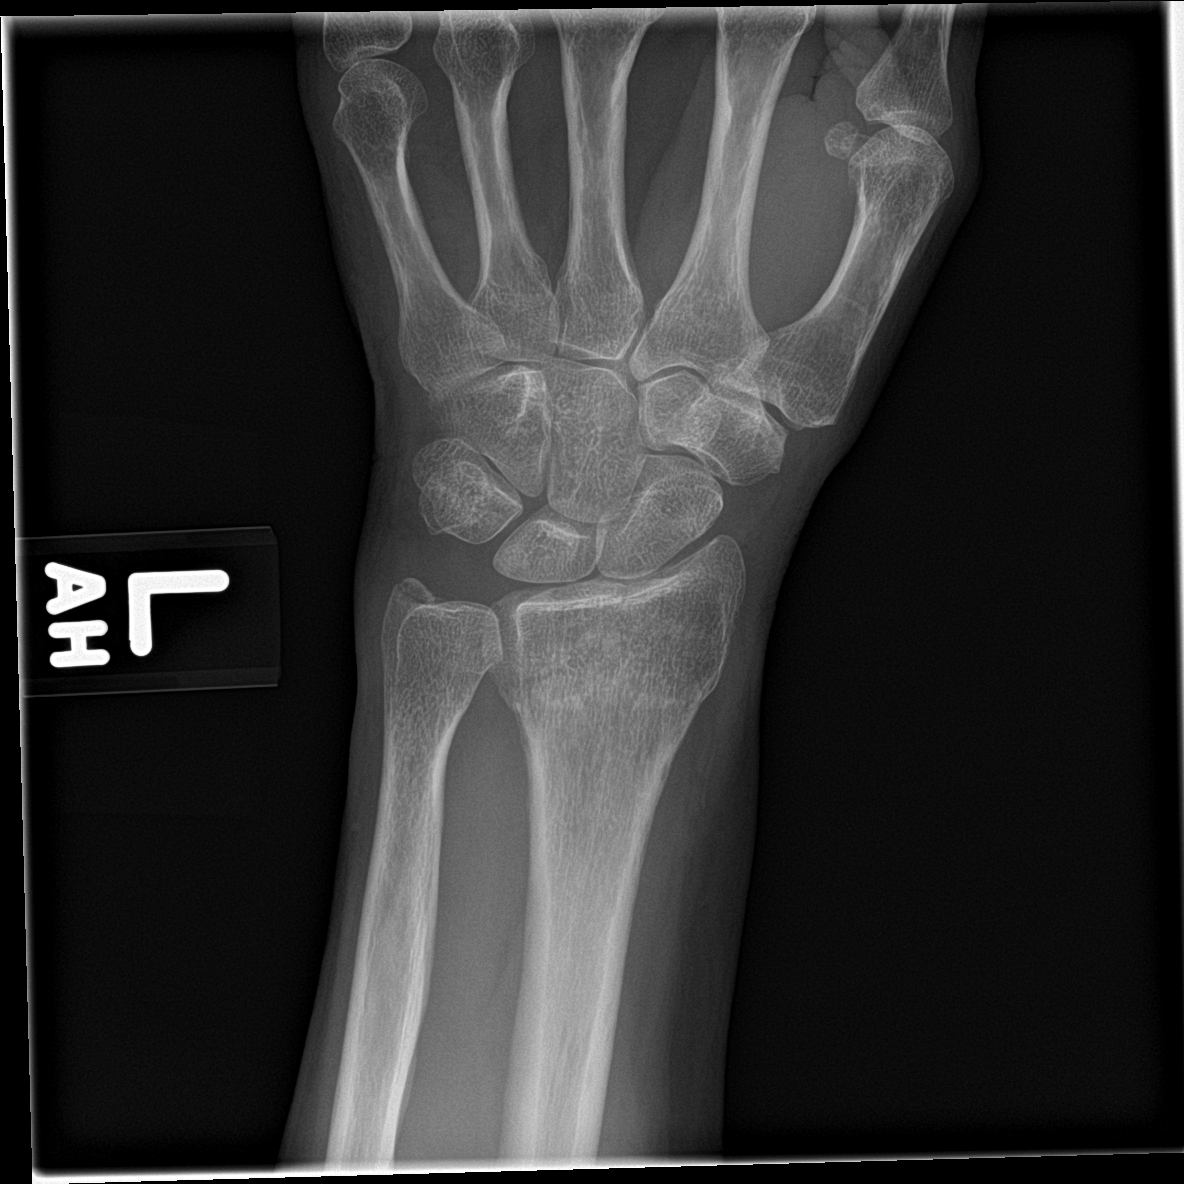

[wrist obl]
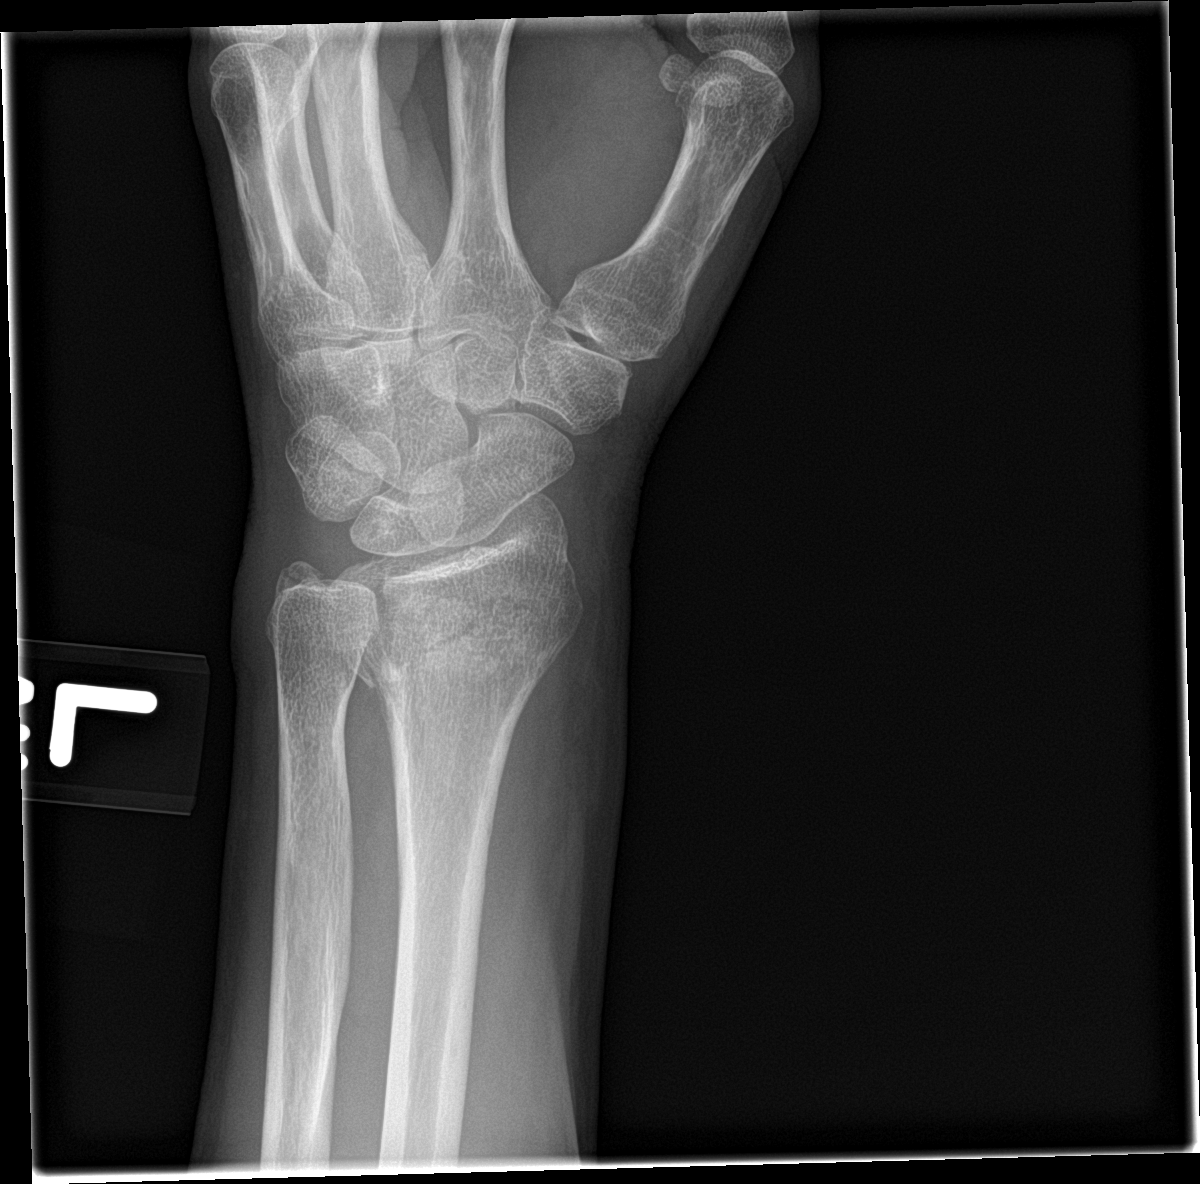

[wrist lat]
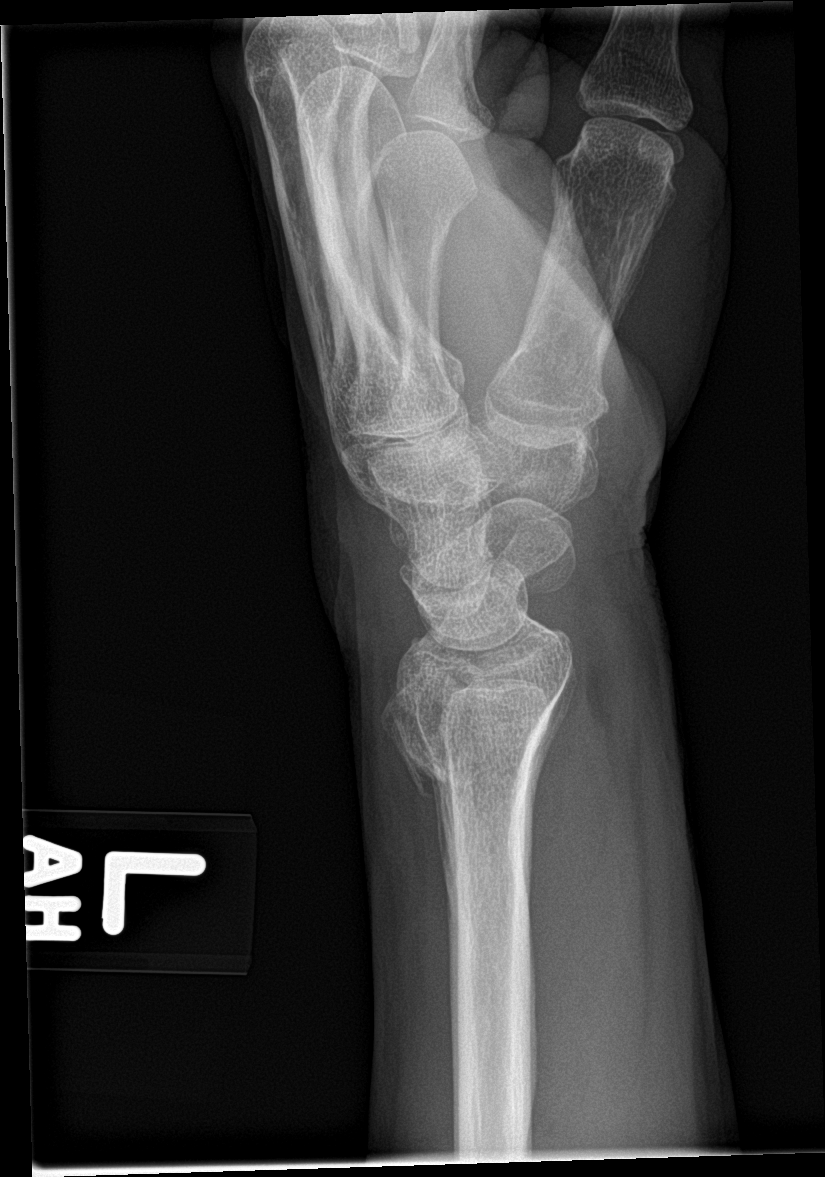

[wrist navicular]
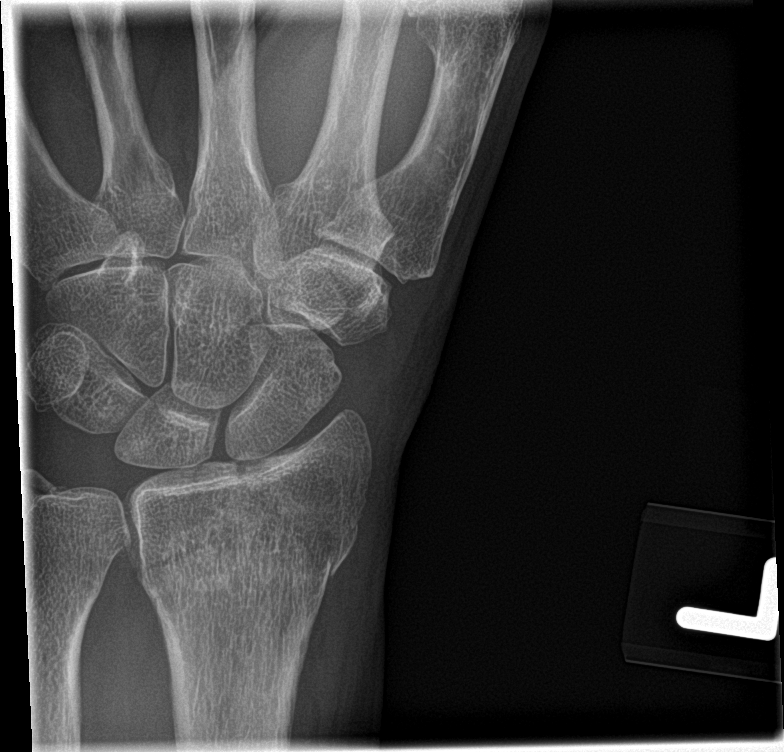

[4 of 4 positions shown; findings below may reference images not displayed]

FINDINGS: Distal radial metaphyseal fracture, with transverse metaphyseal
component and longitudinal component extending to the distal radial
articular surface. Apex volar angulation.

Transverse fracture of the ulnar styloid.
IMPRESSION: 1. Distal radial fracture with transverse metaphyseal component and
longitudinal extension to the distal radial articular surface.
2. Transverse fracture of the ulnar styloid.
